# Patient Record
Sex: Female | Born: 1985 | Race: Black or African American | Hispanic: No | Marital: Single | Smoking: Never smoker
Health system: Southern US, Community
[De-identification: ages and names within clinical notes are randomized; demographics above are authoritative.]

---

## 2019-09-15 ENCOUNTER — Encounter (HOSPITAL_COMMUNITY): Payer: Self-pay | Admitting: Family Medicine

## 2019-09-15 ENCOUNTER — Other Ambulatory Visit: Payer: Self-pay

## 2019-09-15 ENCOUNTER — Inpatient Hospital Stay (HOSPITAL_COMMUNITY)
Admission: AD | Admit: 2019-09-15 | Discharge: 2019-09-15 | Disposition: A | Discharge status: Home / Self Care | Payer: Medicaid Other | Attending: Family Medicine | Admitting: Family Medicine

## 2019-09-15 ENCOUNTER — Inpatient Hospital Stay (HOSPITAL_COMMUNITY): Payer: Medicaid Other

## 2019-09-15 DIAGNOSIS — R1031 Right lower quadrant pain: Secondary | ICD-10-CM | POA: Insufficient documentation

## 2019-09-15 DIAGNOSIS — R109 Unspecified abdominal pain: Secondary | ICD-10-CM | POA: Diagnosis present

## 2019-09-15 DIAGNOSIS — Z3A01 Less than 8 weeks gestation of pregnancy: Secondary | ICD-10-CM | POA: Insufficient documentation

## 2019-09-15 DIAGNOSIS — O210 Mild hyperemesis gravidarum: Secondary | ICD-10-CM | POA: Diagnosis not present

## 2019-09-15 DIAGNOSIS — R1032 Left lower quadrant pain: Secondary | ICD-10-CM | POA: Diagnosis not present

## 2019-09-15 DIAGNOSIS — O26899 Other specified pregnancy related conditions, unspecified trimester: Secondary | ICD-10-CM

## 2019-09-15 DIAGNOSIS — O26891 Other specified pregnancy related conditions, first trimester: Secondary | ICD-10-CM | POA: Diagnosis not present

## 2019-09-15 DIAGNOSIS — R102 Pelvic and perineal pain: Secondary | ICD-10-CM

## 2019-09-15 DIAGNOSIS — O219 Vomiting of pregnancy, unspecified: Secondary | ICD-10-CM

## 2019-09-15 LAB — URINALYSIS, ROUTINE W REFLEX MICROSCOPIC
Bacteria, UA: NONE SEEN
Bilirubin Urine: NEGATIVE
Glucose, UA: NEGATIVE mg/dL
Ketones, ur: 5 mg/dL — AB
Leukocytes,Ua: NEGATIVE
Nitrite: NEGATIVE
Protein, ur: NEGATIVE mg/dL
Specific Gravity, Urine: 1.031 — ABNORMAL HIGH (ref 1.005–1.030)
pH: 5 (ref 5.0–8.0)

## 2019-09-15 LAB — CBC
HCT: 34.3 % — ABNORMAL LOW (ref 36.0–46.0)
Hemoglobin: 10.9 g/dL — ABNORMAL LOW (ref 12.0–15.0)
MCH: 25.2 pg — ABNORMAL LOW (ref 26.0–34.0)
MCHC: 31.8 g/dL (ref 30.0–36.0)
MCV: 79.4 fL — ABNORMAL LOW (ref 80.0–100.0)
Platelets: 266 10*3/uL (ref 150–400)
RBC: 4.32 MIL/uL (ref 3.87–5.11)
RDW: 14.1 % (ref 11.5–15.5)
WBC: 8.6 10*3/uL (ref 4.0–10.5)
nRBC: 0 % (ref 0.0–0.2)

## 2019-09-15 LAB — WET PREP, GENITAL
Sperm: NONE SEEN
Trich, Wet Prep: NONE SEEN
Yeast Wet Prep HPF POC: NONE SEEN

## 2019-09-15 LAB — GC/CHLAMYDIA PROBE AMP (~~LOC~~) NOT AT ARMC
Chlamydia: NEGATIVE
Comment: NEGATIVE
Comment: NORMAL
Neisseria Gonorrhea: NEGATIVE

## 2019-09-15 LAB — POC URINE PREG, ED
Preg Test, Ur: POSITIVE — AB
Preg Test, Ur: POSITIVE — AB

## 2019-09-15 LAB — ABO/RH: ABO/RH(D): B POS

## 2019-09-15 LAB — HCG, QUANTITATIVE, PREGNANCY: hCG, Beta Chain, Quant, S: 78559 m[IU]/mL — ABNORMAL HIGH (ref <5)

## 2019-09-15 LAB — HIV ANTIBODY (ROUTINE TESTING W REFLEX): HIV Screen 4th Generation wRfx: NONREACTIVE

## 2019-09-15 MED ORDER — PROMETHAZINE HCL 25 MG PO TABS
25.0000 mg | ORAL_TABLET | Freq: Once | ORAL | Status: AC
  Administered 2019-09-15: 06:00:00 25 mg via ORAL
  Filled 2019-09-15: qty 1

## 2019-09-15 MED ORDER — PROMETHAZINE HCL 25 MG PO TABS: 25.0000 mg | ORAL_TABLET | Freq: Four times a day (QID) | ORAL | 2 refills | Status: AC | PRN

## 2019-09-15 NOTE — ED Notes (Signed)
PA Dahlia Client made aware of patient, will come assess patient for transfer to [facility]U once POC urine preg is obtained/positive.

## 2019-09-15 NOTE — Discharge Instructions (Signed)
First Trimester of Pregnancy The first trimester of pregnancy is from week 1 until the end of week 13 (months 1 through 3). A week after a sperm fertilizes an egg, the egg will implant on the wall of the uterus. This embryo will begin to develop into a baby. Genes from you and your partner will form the baby. The female genes will determine whether the baby will be a boy or a girl. At 6-8 weeks, the eyes and face will be formed, and the heartbeat can be seen on ultrasound. At the end of 12 weeks, all the baby's organs will be formed. Now that you are pregnant, you will want to do everything you can to have a healthy baby. Two of the most important things are to get good prenatal care and to follow your health care provider's instructions. Prenatal care is all the medical care you receive before the baby's birth. This care will help prevent, find, and treat any problems during the pregnancy and childbirth. Body changes during your first trimester Your body goes through many changes during pregnancy. The changes vary from woman to woman.  You may gain or lose a couple of pounds at first.  You may feel sick to your stomach (nauseous) and you may throw up (vomit). If the vomiting is uncontrollable, call your health care provider.  You may tire easily.  You may develop headaches that can be relieved by medicines. All medicines should be approved by your health care provider.  You may urinate more often. Painful urination may mean you have a bladder infection.  You may develop heartburn as a result of your pregnancy.  You may develop constipation because certain hormones are causing the muscles that push stool through your intestines to slow down.  You may develop hemorrhoids or swollen veins (varicose veins).  Your breasts may begin to grow larger and become tender. Your nipples may stick out more, and the tissue that surrounds them (areola) may become darker.  Your gums may bleed and may be  sensitive to brushing and flossing.  Dark spots or blotches (chloasma, mask of pregnancy) may develop on your face. This will likely fade after the baby is born.  Your menstrual periods will stop.  You may have a loss of appetite.  You may develop cravings for certain kinds of food.  You may have changes in your emotions from day to day, such as being excited to be pregnant or being concerned that something may go wrong with the pregnancy and baby.  You may have more vivid and strange dreams.  You may have changes in your hair. These can include thickening of your hair, rapid growth, and changes in texture. Some women also have hair loss during or after pregnancy, or hair that feels dry or thin. Your hair will most likely return to normal after your baby is born. What to expect at prenatal visits During a routine prenatal visit:  You will be weighed to make sure you and the baby are growing normally.  Your blood pressure will be taken.  Your abdomen will be measured to track your baby's growth.  The fetal heartbeat will be listened to between weeks 10 and 14 of your pregnancy.  Test results from any previous visits will be discussed. Your health care provider may ask you:  How you are feeling.  If you are feeling the baby move.  If you have had any abnormal symptoms, such as leaking fluid, bleeding, severe headaches, or abdominal   cramping.  If you are using any tobacco products, including cigarettes, chewing tobacco, and electronic cigarettes.  If you have any questions. Other tests that may be performed during your first trimester include:  Blood tests to find your blood type and to check for the presence of any previous infections. The tests will also be used to check for low iron levels (anemia) and protein on red blood cells (Rh antibodies). Depending on your risk factors, or if you previously had diabetes during pregnancy, you may have tests to check for high blood sugar  that affects pregnant women (gestational diabetes).  Urine tests to check for infections, diabetes, or protein in the urine.  An ultrasound to confirm the proper growth and development of the baby.  Fetal screens for spinal cord problems (spina bifida) and Down syndrome.  HIV (human immunodeficiency virus) testing. Routine prenatal testing includes screening for HIV, unless you choose not to have this test.  You may need other tests to make sure you and the baby are doing well. Follow these instructions at home: Medicines  Follow your health care provider's instructions regarding medicine use. Specific medicines may be either safe or unsafe to take during pregnancy.  Take a prenatal vitamin that contains at least 600 micrograms (mcg) of folic acid.  If you develop constipation, try taking a stool softener if your health care provider approves. Eating and drinking   Eat a balanced diet that includes fresh fruits and vegetables, whole grains, good sources of protein such as meat, eggs, or tofu, and low-fat dairy. Your health care provider will help you determine the amount of weight gain that is right for you.  Avoid raw meat and uncooked cheese. These carry germs that can cause birth defects in the baby.  Eating four or five small meals rather than three large meals a day may help relieve nausea and vomiting. If you start to feel nauseous, eating a few soda crackers can be helpful. Drinking liquids between meals, instead of during meals, also seems to help ease nausea and vomiting.  Limit foods that are high in fat and processed sugars, such as fried and sweet foods.  To prevent constipation: ? Eat foods that are high in fiber, such as fresh fruits and vegetables, whole grains, and beans. ? Drink enough fluid to keep your urine clear or pale yellow. Activity  Exercise only as directed by your health care provider. Most women can continue their usual exercise routine during  pregnancy. Try to exercise for 30 minutes at least 5 days a week. Exercising will help you: ? Control your weight. ? Stay in shape. ? Be prepared for labor and delivery.  Experiencing pain or cramping in the lower abdomen or lower back is a good sign that you should stop exercising. Check with your health care provider before continuing with normal exercises.  Try to avoid standing for long periods of time. Move your legs often if you must stand in one place for a long time.  Avoid heavy lifting.  Wear low-heeled shoes and practice good posture.  You may continue to have sex unless your health care provider tells you not to. Relieving pain and discomfort  Wear a good support bra to relieve breast tenderness.  Take warm sitz baths to soothe any pain or discomfort caused by hemorrhoids. Use hemorrhoid cream if your health care provider approves.  Rest with your legs elevated if you have leg cramps or low back pain.  If you develop varicose veins in   your legs, wear support hose. Elevate your feet for 15 minutes, 3-4 times a day. Limit salt in your diet. Prenatal care  Schedule your prenatal visits by the twelfth week of pregnancy. They are usually scheduled monthly at first, then more often in the last 2 months before delivery.  Write down your questions. Take them to your prenatal visits.  Keep all your prenatal visits as told by your health care provider. This is important. Safety  Wear your seat belt at all times when driving.  Make a list of emergency phone numbers, including numbers for family, friends, the hospital, and police and fire departments. General instructions  Ask your health care provider for a referral to a local prenatal education class. Begin classes no later than the beginning of month 6 of your pregnancy.  Ask for help if you have counseling or nutritional needs during pregnancy. Your health care provider can offer advice or refer you to specialists for help  with various needs.  Do not use hot tubs, steam rooms, or saunas.  Do not douche or use tampons or scented sanitary pads.  Do not cross your legs for long periods of time.  Avoid cat litter boxes and soil used by cats. These carry germs that can cause birth defects in the baby and possibly loss of the fetus by miscarriage or stillbirth.  Avoid all smoking, herbs, alcohol, and medicines not prescribed by your health care provider. Chemicals in these products affect the formation and growth of the baby.  Do not use any products that contain nicotine or tobacco, such as cigarettes and e-cigarettes. If you need help quitting, ask your health care provider. You may receive counseling support and other resources to help you quit.  Schedule a dentist appointment. At home, brush your teeth with a soft toothbrush and be gentle when you floss. Contact a health care provider if:  You have dizziness.  You have mild pelvic cramps, pelvic pressure, or nagging pain in the abdominal area.  You have persistent nausea, vomiting, or diarrhea.  You have a bad smelling vaginal discharge.  You have pain when you urinate.  You notice increased swelling in your face, hands, legs, or ankles.  You are exposed to fifth disease or chickenpox.  You are exposed to [place] measles (rubella) and have never had it. Get help right away if:  You have a fever.  You are leaking fluid from your vagina.  You have spotting or bleeding from your vagina.  You have severe abdominal cramping or pain.  You have rapid weight gain or loss.  You vomit blood or material that looks like coffee grounds.  You develop a severe headache.  You have shortness of breath.  You have any kind of trauma, such as from a fall or a car accident. Summary  The first trimester of pregnancy is from week 1 until the end of week 13 (months 1 through 3).  Your body goes through many changes during pregnancy. The changes vary from  woman to woman.  You will have routine prenatal visits. During those visits, your health care provider will examine you, discuss any test results you may have, and talk with you about how you are feeling. This information is not intended to replace advice given to you by your health care provider. Make sure you discuss any questions you have with your health care provider. Document Revised: 04/25/2017 Document Reviewed: 04/24/2016 Elsevier Patient Education  2020 Elsevier Inc.   [place] [facility] Providers  Center for [facility] at [facility]       Phone: [phone]  Center for [facility] at [place]   Phone: [phone]  Center for [facility] at [place]  Phone: [phone]  Center for [facility] at [facility]  Phone: [phone]  Center for [facility] at [place]  Phone: [phone]  [facility] for [facility] at [facility]   Phone: [phone]  [facility]       Phone: [phone]  [facility]    Phone: [phone]   [facility]    Phone: [phone]  [facility]    Phone: [phone]  [facility]    Phone: [phone]  [facility] Health Department-Family Planning       Phone: [phone]   [facility] Health Department-Maternity  Phone: [phone]  [facility]    Phone: [phone]  [facility]   Phone: [phone]  [facility]      Phone: [phone]  [facility]    Phone: [phone]

## 2019-09-15 NOTE — [facility] Provider Note (Signed)
Chief Complaint: Abdominal Pain   First Provider Initiated Contact with Patient 09/15/19 0519        SUBJECTIVE HPI: Karen Blanchard is a 34 y.o. G3P0002 at [redacted]w[redacted]d by LMP who presents to maternity admissions reporting pelvic pain for one week.  Also has some nausea and vomiting. Has not tried anything for this.  . She denies vaginal bleeding, vaginal itching/burning, urinary symptoms, h/a, dizziness, or fever/chills.    Abdominal Pain This is a new problem. The current episode started in the past 7 days. The onset quality is gradual. The problem occurs intermittently. The problem has been unchanged. The pain is located in the suprapubic region, LLQ and RLQ. The quality of the pain is cramping. The abdominal pain does not radiate. Associated symptoms include nausea and vomiting. Pertinent negatives include no constipation, diarrhea, dysuria, fever or frequency. Nothing aggravates the pain. The pain is relieved by nothing. She has tried nothing for the symptoms.  Emesis  This is a new problem. The current episode started 1 to 4 weeks ago. There has been no fever. Associated symptoms include abdominal pain. Pertinent negatives include no chest pain, chills, coughing, diarrhea, dizziness or fever. She has tried nothing for the symptoms.   ED Note: Patient is a G32P68 34 year old female, approximately 5 weeks who presents to the emergency department with a chief complaint of lower abdominal pain and cramping x1 week. She reports associated nausea and has vomiting when she tries to eat. She denies any fever chills. She denies any dysuria or hematuria. She denies any vaginal bleeding, but states that she has had some slight discharge. She denies any other associated symptoms.  History reviewed. No pertinent past medical history. History reviewed. No pertinent surgical history. Social History   Socioeconomic History  . Marital status: Single    Spouse name: Not on file  . Number of children: Not on  file  . Years of education: Not on file  . Highest education level: Not on file  Occupational History  . Not on file  Tobacco Use  . Smoking status: Never Smoker  . Smokeless tobacco: Never Used  Substance and Sexual Activity  . Alcohol use: Never  . Drug use: Never  . Sexual activity: Yes  Other Topics Concern  . Not on file  Social History Narrative  . Not on file   Social Determinants of Health   Financial Resource Strain:   . Difficulty of Paying Living Expenses:   Food Insecurity:   . Worried About Programme researcher, broadcasting/film/video in the Last Year:   . Barista in the Last Year:   Transportation Needs:   . Freight forwarder (Medical):   Marland Kitchen Lack of Transportation (Non-Medical):   Physical Activity:   . Days of Exercise per Week:   . Minutes of Exercise per Session:   Stress:   . Feeling of Stress :   Social Connections:   . Frequency of Communication with Friends and Family:   . Frequency of Social Gatherings with Friends and Family:   . Attends Religious Services:   . Active Member of Clubs or Organizations:   . Attends Banker Meetings:   Marland Kitchen Marital Status:   Intimate Partner Violence:   . Fear of Current or Ex-Partner:   . Emotionally Abused:   Marland Kitchen Physically Abused:   . Sexually Abused:    No current facility-administered medications on file prior to encounter.   No current outpatient medications on file prior to  encounter.   No Known Allergies  I have reviewed patient's Past Medical Hx, Surgical Hx, Family Hx, Social Hx, medications and allergies.   ROS:  Review of Systems  Constitutional: Negative for chills and fever.  Respiratory: Negative for cough.   Cardiovascular: Negative for chest pain.  Gastrointestinal: Positive for abdominal pain, nausea and vomiting. Negative for constipation and diarrhea.  Genitourinary: Negative for dysuria and frequency.  Neurological: Negative for dizziness.   Review of Systems  Other systems  negative   Physical Exam  Physical Exam Patient Vitals for the past 24 hrs:  BP Temp Temp src Pulse Resp SpO2 Height Weight  09/15/19 0504 106/61 98.3 F (36.8 C) Oral (!) 58 20 -- 5\' 8"  (1.727 m) 86.3 kg  09/15/19 0407 (!) 122/52 (!) 97.4 F (36.3 C) Oral 60 16 100 % -- --   Constitutional: Well-developed, well-nourished female in no acute distress.  Cardiovascular: normal rate Respiratory: normal effort GI: Abd soft, non-tender. Pos BS x 4 MS: Extremities nontender, no edema, normal ROM Neurologic: Alert and oriented x 4.  GU: Neg CVAT.  PELVIC EXAM: Cervix pink, visually closed, without lesion, scant white creamy discharge, vaginal walls and external genitalia normal Bimanual exam: Cervix 0/long/high, firm, anterior, neg CMT, uterus tender, nonenlarged, adnexa without tenderness, enlargement, or mass    LAB RESULTS Results for orders placed or performed during the hospital encounter of 09/15/19 (from the past 24 hour(s))  POC Urine Pregnancy, ED (not at [facility])     Status: Abnormal   Collection Time: 09/15/19  4:14 AM  Result Value Ref Range   Preg Test, Ur POSITIVE (A) NEGATIVE  POC Urine Pregnancy, ED (not at [facility])     Status: Abnormal   Collection Time: 09/15/19  4:24 AM  Result Value Ref Range   Preg Test, Ur POSITIVE (A) NEGATIVE  Wet prep, genital     Status: Abnormal   Collection Time: 09/15/19  5:20 AM   Specimen: Vaginal  Result Value Ref Range   Yeast Wet Prep HPF POC NONE SEEN NONE SEEN   Trich, Wet Prep NONE SEEN NONE SEEN   Clue Cells Wet Prep HPF POC PRESENT (A) NONE SEEN   WBC, Wet Prep HPF POC MODERATE (A) NONE SEEN   Sperm NONE SEEN   CBC     Status: Abnormal   Collection Time: 09/15/19  5:28 AM  Result Value Ref Range   WBC 8.6 4.0 - 10.5 K/uL   RBC 4.32 3.87 - 5.11 MIL/uL   Hemoglobin 10.9 (L) 12.0 - 15.0 g/dL   HCT 34.3 (L) 36.0 - 46.0 %   MCV 79.4 (L) 80.0 - 100.0 fL   MCH 25.2 (L) 26.0 - 34.0 pg   MCHC 31.8 30.0 - 36.0 g/dL   RDW 14.1 11.5 -  15.5 %   Platelets 266 150 - 400 K/uL   nRBC 0.0 0.0 - 0.2 %  ABO/Rh     Status: None   Collection Time: 09/15/19  5:42 AM  Result Value Ref Range   ABO/RH(D) B POS    No rh immune globuloin      NOT A RH IMMUNE GLOBULIN CANDIDATE, PT RH POSITIVE Performed at [facility], [address], [place], [place] [address]   Urinalysis, Routine w reflex microscopic     Status: Abnormal   Collection Time: 09/15/19  6:02 AM  Result Value Ref Range   Color, Urine YELLOW YELLOW   APPearance CLEAR CLEAR   Specific Gravity, Urine 1.031 (  H) 1.005 - 1.030   pH 5.0 5.0 - 8.0   Glucose, UA NEGATIVE NEGATIVE mg/dL   Hgb urine dipstick SMALL (A) NEGATIVE   Bilirubin Urine NEGATIVE NEGATIVE   Ketones, ur 5 (A) NEGATIVE mg/dL   Protein, ur NEGATIVE NEGATIVE mg/dL   Nitrite NEGATIVE NEGATIVE   Leukocytes,Ua NEGATIVE NEGATIVE   RBC / HPF 6-10 0 - 5 RBC/hpf   WBC, UA 0-5 0 - 5 WBC/hpf   Bacteria, UA NONE SEEN NONE SEEN   Squamous Epithelial / LPF 0-5 0 - 5   Mucus PRESENT     IMAGING US OB Comp Less 14 Wks  Result Date: 09/15/2019 CLINICAL DATA:  Lower abdominal pain for 2 weeks. Positive urine pregnancy test. EXAM: OBSTETRIC <14 WK [place] AND TRANSVAGINAL OB US TECHNIQUE: Both transabdominal and transvaginal ultrasound examinations were performed for complete evaluation of the gestation as well as the maternal uterus, adnexal regions, and pelvic cul-de-sac. Transvaginal technique was performed to assess early pregnancy. COMPARISON:  None. FINDINGS: Intrauterine gestational sac: Present Yolk sac:  Present Embryo:  Present Cardiac Activity: Present Heart Rate: 140 bpm CRL:  11.6 mm   7 w   2 d                  [place] EDC: 05/01/2020 Subchorionic hemorrhage:  None visualized. Maternal uterus/adnexae: Both ovaries are normal. No free pelvic fluid collections. IMPRESSION: 1. Single living intrauterine embryo estimated at 7 weeks and 2 days gestation. 2. No subchorionic hemorrhage. 3. Normal ovaries.  Electronically Signed   By: Rudie Meyer M.D.   On: 09/15/2019 06:50   US OB Transvaginal  Result Date: 09/15/2019 CLINICAL DATA:  Lower abdominal pain for 2 weeks. Positive urine pregnancy test. EXAM: OBSTETRIC <14 WK [place] AND TRANSVAGINAL OB US TECHNIQUE: Both transabdominal and transvaginal ultrasound examinations were performed for complete evaluation of the gestation as well as the maternal uterus, adnexal regions, and pelvic cul-de-sac. Transvaginal technique was performed to assess early pregnancy. COMPARISON:  None. FINDINGS: Intrauterine gestational sac: Present Yolk sac:  Present Embryo:  Present Cardiac Activity: Present Heart Rate: 140 bpm CRL:  11.6 mm   7 w   2 d                  [place] EDC: 05/01/2020 Subchorionic hemorrhage:  None visualized. Maternal uterus/adnexae: Both ovaries are normal. No free pelvic fluid collections. IMPRESSION: 1. Single living intrauterine embryo estimated at 7 weeks and 2 days gestation. 2. No subchorionic hemorrhage. 3. Normal ovaries. Electronically Signed   By: Rudie Meyer M.D.   On: 09/15/2019 06:50     MAU Management/MDM: Ordered usual first trimester r/o ectopic labs.   Pelvic exam and cultures done Will check baseline Ultrasound to rule out ectopic.  This bleeding/pain can represent a normal pregnancy with bleeding, spontaneous abortion or even an ectopic which can be life-threatening.  The process as listed above helps to determine which of these is present.  Phenergan 25mg  po given which did help her nausea. She was able to tolerate PO intake afterward.   ASSESSMENT 1. Abdominal pain during pregnancy in first trimester   2.     Nausea and vomiting of pregnancy  PLAN Discharge home Rx Phenergan for prn use for nausea List of OB providers given  Pt stable at time of discharge. Encouraged to return here or to other Urgent Care/ED if she develops worsening of symptoms, increase in pain, fever, or other concerning symptoms.     CNM, MSN Certified Nurse-Midwife 09/15/2019  5:19 AM

## 2019-09-15 NOTE — ED Provider Notes (Signed)
[facility] EMERGENCY DEPARTMENT Provider Note   CSN: [id number] Arrival date & time: 09/15/19  0402     History No chief complaint on file.   Karen Blanchard is a 34 y.o. female.  Patient is a G64P81 34 year old female, approximately 5 weeks who presents to the emergency department with a chief complaint of lower abdominal pain and cramping x1 week. She reports associated nausea and has vomiting when she tries to eat. She denies any fever chills. She denies any dysuria or hematuria. She denies any vaginal bleeding, but states that she has had some slight discharge. She denies any other associated symptoms.  The history is provided by the patient. No language interpreter was used.       No past medical history on file.  There are no problems to display for this patient.    OB History   No obstetric history on file.     No family history on file.  Social History   Tobacco Use  . Smoking status: Not on file  Substance Use Topics  . Alcohol use: Not on file  . Drug use: Not on file    Home Medications Prior to Admission medications   Not on File    Allergies    Patient has no allergy information on record.  Review of Systems   Review of Systems  Gastrointestinal: Positive for abdominal pain, nausea and vomiting.  Genitourinary: Positive for vaginal discharge. Negative for vaginal bleeding.  All other systems reviewed and are negative.   Physical Exam Updated Vital Signs BP (!) 122/52 (BP Location: Left Arm)   Pulse 60   Temp (!) 97.4 F (36.3 C) (Oral)   Resp 16   SpO2 100%   Physical Exam Vitals and nursing note reviewed.  Constitutional:      General: She is not in acute distress.    Appearance: She is well-developed.  HENT:     Head: Normocephalic and atraumatic.  Eyes:     Conjunctiva/sclera: Conjunctivae normal.  Cardiovascular:     Rate and Rhythm: Normal rate.     Heart sounds: No murmur.  Pulmonary:     Effort:  Pulmonary effort is normal. No respiratory distress.  Abdominal:     General: There is no distension.     Tenderness: There is no abdominal tenderness.     Comments: No focal abdominal tenderness, there is generalized discomfort  Genitourinary:    Comments: Deferred to MAU Musculoskeletal:     Cervical back: Neck supple.     Comments: Moves all extremities  Skin:    General: Skin is warm and dry.  Neurological:     Mental Status: She is alert and oriented to person, place, and time.  Psychiatric:        Mood and Affect: Mood normal.        Behavior: Behavior normal.     ED Results / Procedures / Treatments   Labs (all labs ordered are listed, but only abnormal results are displayed) Labs Reviewed  POC URINE PREG, ED - Abnormal; Notable for the following components:      Result Value   Preg Test, Ur POSITIVE (*)    All other components within normal limits  POC URINE PREG, ED    EKG None  Radiology No results found.  Procedures Procedures (including critical care time)  Medications Ordered in ED Medications - No data to display  ED Course  I have reviewed the triage vital signs and the  nursing notes.  Pertinent labs & imaging results that were available during my care of the patient were reviewed by me and considered in my medical decision making (see chart for details).    MDM Rules/Calculators/A&P                      G23P84 34 year old female with lower abdominal pain and cramping which has been worsening over the past week. Denies any vaginal bleeding. She does not have any focal tenderness on my exam, but does have generalized discomfort. I discussed case with the CNM at MAU, who accepts in transfer to MAU.  Will need r/o ectopic.  Final Clinical Impression(s) / ED Diagnoses Final diagnoses:  Abdominal pain during pregnancy in first trimester    Rx / DC Orders ED Discharge Orders    None       Montine Circle, PA-C 09/15/19 1950    Ripley Fraise, MD 09/15/19 [phone]

## 2019-09-15 NOTE — MAU Note (Signed)
PT SAYS SHE HAS LOWER ABD PAIN - STARTED 2 WEEKS AGO .  HAS VAG D/C- WHITE  . LAST SEX-  1 WEEK AGO

## 2019-09-15 NOTE — ED Notes (Signed)
Pt cleared by Roxy Horseman, PA to go to MAU.

## 2019-10-09 ENCOUNTER — Inpatient Hospital Stay (HOSPITAL_COMMUNITY)
Admission: AD | Admit: 2019-10-09 | Discharge: 2019-10-09 | Disposition: A | Discharge status: Home / Self Care | Payer: Medicaid Other | Attending: Obstetrics & Gynecology | Admitting: Obstetrics & Gynecology

## 2019-10-09 ENCOUNTER — Encounter (HOSPITAL_COMMUNITY): Payer: Self-pay | Admitting: Obstetrics & Gynecology

## 2019-10-09 ENCOUNTER — Other Ambulatory Visit: Payer: Self-pay

## 2019-10-09 DIAGNOSIS — Z3A10 10 weeks gestation of pregnancy: Secondary | ICD-10-CM | POA: Diagnosis not present

## 2019-10-09 DIAGNOSIS — O26891 Other specified pregnancy related conditions, first trimester: Secondary | ICD-10-CM | POA: Diagnosis not present

## 2019-10-09 DIAGNOSIS — O219 Vomiting of pregnancy, unspecified: Secondary | ICD-10-CM | POA: Insufficient documentation

## 2019-10-09 DIAGNOSIS — R1011 Right upper quadrant pain: Secondary | ICD-10-CM | POA: Insufficient documentation

## 2019-10-09 LAB — COMPREHENSIVE METABOLIC PANEL
ALT: 10 U/L (ref 0–44)
AST: 12 U/L — ABNORMAL LOW (ref 15–41)
Albumin: 3 g/dL — ABNORMAL LOW (ref 3.5–5.0)
Alkaline Phosphatase: 50 U/L (ref 38–126)
Anion gap: 7 (ref 5–15)
BUN: 5 mg/dL — ABNORMAL LOW (ref 6–20)
CO2: 23 mmol/L (ref 22–32)
Calcium: 8.8 mg/dL — ABNORMAL LOW (ref 8.9–10.3)
Chloride: 105 mmol/L (ref 98–111)
Creatinine, Ser: 0.79 mg/dL (ref 0.44–1.00)
GFR calc Af Amer: >60 mL/min (ref >60)
GFR calc non Af Amer: >60 mL/min (ref >60)
Glucose, Bld: 95 mg/dL (ref 70–99)
Potassium: 3.9 mmol/L (ref 3.5–5.1)
Sodium: 135 mmol/L (ref 135–145)
Total Bilirubin: 0.5 mg/dL (ref 0.3–1.2)
Total Protein: 6.7 g/dL (ref 6.5–8.1)

## 2019-10-09 LAB — URINALYSIS, ROUTINE W REFLEX MICROSCOPIC
Bilirubin Urine: NEGATIVE
Glucose, UA: NEGATIVE mg/dL
Ketones, ur: NEGATIVE mg/dL
Nitrite: NEGATIVE
Protein, ur: 30 mg/dL — AB
Specific Gravity, Urine: 1.026 (ref 1.005–1.030)
pH: 6 (ref 5.0–8.0)

## 2019-10-09 LAB — CBC WITH DIFFERENTIAL/PLATELET
Abs Immature Granulocytes: 0.02 10*3/uL (ref 0.00–0.07)
Basophils Absolute: 0 10*3/uL (ref 0.0–0.1)
Basophils Relative: 0 %
Eosinophils Absolute: 0.2 10*3/uL (ref 0.0–0.5)
Eosinophils Relative: 2 %
HCT: 35.3 % — ABNORMAL LOW (ref 36.0–46.0)
Hemoglobin: 11.4 g/dL — ABNORMAL LOW (ref 12.0–15.0)
Immature Granulocytes: 0 %
Lymphocytes Relative: 25 %
Lymphs Abs: 2.5 10*3/uL (ref 0.7–4.0)
MCH: 26 pg (ref 26.0–34.0)
MCHC: 32.3 g/dL (ref 30.0–36.0)
MCV: 80.4 fL (ref 80.0–100.0)
Monocytes Absolute: 0.8 10*3/uL (ref 0.1–1.0)
Monocytes Relative: 8 %
Neutro Abs: 6.4 10*3/uL (ref 1.7–7.7)
Neutrophils Relative %: 65 %
Platelets: 254 10*3/uL (ref 150–400)
RBC: 4.39 MIL/uL (ref 3.87–5.11)
RDW: 14.4 % (ref 11.5–15.5)
WBC: 9.9 10*3/uL (ref 4.0–10.5)
nRBC: 0 % (ref 0.0–0.2)

## 2019-10-09 LAB — LIPASE, BLOOD: Lipase: 35 U/L (ref 11–51)

## 2019-10-09 LAB — AMYLASE: Amylase: 65 U/L (ref 28–100)

## 2019-10-09 MED ORDER — ACETAMINOPHEN 500 MG PO TABS
1000.0000 mg | ORAL_TABLET | Freq: Once | ORAL | Status: AC
  Administered 2019-10-09: 1000 mg via ORAL
  Filled 2019-10-09: qty 2

## 2019-10-09 MED ORDER — ONDANSETRON 4 MG PO TBDP: 4.0000 mg | ORAL_TABLET | Freq: Three times a day (TID) | ORAL | 0 refills | Status: AC | PRN

## 2019-10-09 NOTE — Discharge Instructions (Signed)
Follow these instructions at home: 1. Take over-the-counter and prescription medicines only as told by your health care provider. 2. Maintain a healthy weight and follow a healthy diet. This includes: ? Reducing fatty foods, such as fried food. ? Reducing refined carbohydrates, like white bread and white rice. ? Increasing fiber. Aim for foods like almonds, fruit, and beans. 3. Keep all follow-up visits as told by your health care provider. This is important.   Return for worsening pain not controlled by tylenol, uncontrolled vomiting, or fever (temperature above 100.5 in setting of abdominal pain).        [facility] [employer] for [employer] at [facility]  [address], [place], [place] [address]  [phone]  Center for [facility] at [facility]  [address], [place], [place] [address]  [phone]  Center for [facility] at [facility] [address], [place], [place] [address]  [phone]  Center for [facility] at [facility]  [address] [place], [place] [address]  [phone]  Center for [facility] at [facility]  [address]First floor), [place], [place] [address]  [phone]  Center for [facility] at [facility]  [address] [place], [place], [place] [address]  [phone]  [facility]  [address], [place], [place] [address]  [phone]  [facility]  [address] [place], [place], [place] [address]  [phone]  [facility]  [address] [place], [place] [address]  [phone]  [facility]  [address] [place], [place] [address]  [phone]  [facility]  [address], [place], [place] [address]  [phone]  [facility]   [address] [place], [place] [address]  [phone]  [facility]  [address], [place], [place] [address]    [phone]  [facility]  [address] [place], [place] [address]  [phone]

## 2019-10-09 NOTE — MAU Provider Note (Signed)
Chief Complaint: Abdominal Pain, Nausea, and Emesis   First Provider Initiated Contact with Patient 10/09/19 1608     SUBJECTIVE HPI: Karen Blanchard is a 34 y.o. G3P2002 at [redacted]w[redacted]d who presents to Maternity Admissions reporting abdominal pain. Symptoms started about 2 weeks ago. Reports intermittent pain in her right upper quadrant that occurs several times per day & lasts for hours at a time. Describes as aching with some sharp pain. Nothing makes pain better or worse. Has been having daily nausea & vomiting due to pregnancy. Has phenergan at home but doesn't take it much because of work & side effects. Reports eating a biscuit & [place] sandwich yesterday. Today she had a biscuit & tea prior to coming to MAU.  Denies lower abdominal pain, fever, diarrhea, constipation, or vaginal bleeding. Does not know where she will be going for prenatal care.   Location: abdomen, RUQ Quality: aching, sharp Severity: 10/10 on pain scale Duration: 2 weeks Timing: intermittent Modifying factors: none Associated signs and symptoms: n/v  History reviewed. No pertinent past medical history. OB History  Gravida Para Term Preterm AB Living  3 2 2     2   SAB TAB Ectopic Multiple Live Births          2    # Outcome Date GA Lbr Len/2nd Weight Sex Delivery Anes PTL Lv  3 Current           2 Term      Vag-Spont   LIV  1 Term      Vag-Spont   LIV   History reviewed. No pertinent surgical history. Social History   Socioeconomic History  . Marital status: Single    Spouse name: Not on file  . Number of children: Not on file  . Years of education: Not on file  . Highest education level: Not on file  Occupational History  . Not on file  Tobacco Use  . Smoking status: Never Smoker  . Smokeless tobacco: Never Used  Substance and Sexual Activity  . Alcohol use: Never  . Drug use: Never  . Sexual activity: Yes  Other Topics Concern  . Not on file  Social History Narrative  . Not on file   Social  Determinants of Health   Financial Resource Strain:   . Difficulty of Paying Living Expenses:   Food Insecurity:   . Worried About in the Last Year:   . Programme researcher, broadcasting/film/video in the Last Year:   Transportation Needs:   . Barista (Medical):   Freight forwarder Lack of Transportation (Non-Medical):   Physical Activity:   . Days of Exercise per Week:   . Minutes of Exercise per Session:   Stress:   . Feeling of Stress :   Social Connections:   . Frequency of Communication with Friends and Family:   . Frequency of Social Gatherings with Friends and Family:   . Attends Religious Services:   . Active Member of Clubs or Organizations:   . Attends Marland Kitchen Meetings:   Banker Marital Status:   Intimate Partner Violence:   . Fear of Current or Ex-Partner:   . Emotionally Abused:   Marland [place] Physically Abused:   . Sexually Abused:    History reviewed. No pertinent family history. No current facility-administered medications on file prior to encounter.   Current Outpatient Medications on File Prior to Encounter  Medication Sig Dispense Refill  . promethazine (PHENERGAN) 25 MG tablet Take 1 tablet (  25 mg total) by mouth every 6 (six) hours as needed for nausea or vomiting. 30 tablet 2   No Known Allergies  I have reviewed patient's Past Medical Hx, Surgical Hx, Family Hx, Social Hx, medications and allergies.   Review of Systems  Constitutional: Negative.   Gastrointestinal: Positive for abdominal pain, nausea and vomiting. Negative for constipation and diarrhea.  Genitourinary: Negative.     OBJECTIVE Patient Vitals for the past 24 hrs:  BP Temp Temp src Pulse Resp SpO2 Height Weight  10/09/19 1525 116/65 98.5 F (36.9 C) Oral 84 18 98 % 5\' 8"  (1.727 m) 84.8 kg   Constitutional: Well-developed, well-nourished female in no acute distress.  Cardiovascular: normal rate & rhythm, no murmur Respiratory: normal rate and effort. Lung sounds clear throughout GI:TTP in  RUQ, + Murphy sign. Abd soft, Pos BS x 4. No guarding or rebound tenderness MS: Extremities nontender, no edema, normal ROM Neurologic: Alert and oriented x 4.      LAB RESULTS Results for orders placed or performed during the hospital encounter of 10/09/19 (from the past 24 hour(s))  Urinalysis, Routine w reflex microscopic     Status: Abnormal   Collection Time: 10/09/19  3:49 PM  Result Value Ref Range   Color, Urine YELLOW YELLOW   APPearance HAZY (A) CLEAR   Specific Gravity, Urine 1.026 1.005 - 1.030   pH 6.0 5.0 - 8.0   Glucose, UA NEGATIVE NEGATIVE mg/dL   Hgb urine dipstick SMALL (A) NEGATIVE   Bilirubin Urine NEGATIVE NEGATIVE   Ketones, ur NEGATIVE NEGATIVE mg/dL   Protein, ur 30 (A) NEGATIVE mg/dL   Nitrite NEGATIVE NEGATIVE   Leukocytes,Ua SMALL (A) NEGATIVE   RBC / HPF 11-20 0 - 5 RBC/hpf   WBC, UA 0-5 0 - 5 WBC/hpf   Bacteria, UA RARE (A) NONE SEEN   Squamous Epithelial / LPF 6-10 0 - 5   Mucus PRESENT   Amylase     Status: None   Collection Time: 10/09/19  4:52 PM  Result Value Ref Range   Amylase 65 28 - 100 U/L  CBC with Differential/Platelet     Status: Abnormal   Collection Time: 10/09/19  4:52 PM  Result Value Ref Range   WBC 9.9 4.0 - 10.5 K/uL   RBC 4.39 3.87 - 5.11 MIL/uL   Hemoglobin 11.4 (L) 12.0 - 15.0 g/dL   HCT 10/11/19 (L) 58.8 - 50.2 %   MCV 80.4 80.0 - 100.0 fL   MCH 26.0 26.0 - 34.0 pg   MCHC 32.3 30.0 - 36.0 g/dL   RDW 77.4 12.8 - 78.6 %   Platelets 254 150 - 400 K/uL   nRBC 0.0 0.0 - 0.2 %   Neutrophils Relative % 65 %   Neutro Abs 6.4 1.7 - 7.7 K/uL   Lymphocytes Relative 25 %   Lymphs Abs 2.5 0.7 - 4.0 K/uL   Monocytes Relative 8 %   Monocytes Absolute 0.8 0.1 - 1.0 K/uL   Eosinophils Relative 2 %   Eosinophils Absolute 0.2 0.0 - 0.5 K/uL   Basophils Relative 0 %   Basophils Absolute 0.0 0.0 - 0.1 K/uL   Immature Granulocytes 0 %   Abs Immature Granulocytes 0.02 0.00 - 0.07 K/uL  Comprehensive metabolic panel     Status: Abnormal    Collection Time: 10/09/19  4:52 PM  Result Value Ref Range   Sodium 135 135 - 145 mmol/L   Potassium 3.9 3.5 - 5.1 mmol/L   Chloride  105 98 - 111 mmol/L   CO2 23 22 - 32 mmol/L   Glucose, Bld 95 70 - 99 mg/dL   BUN 5 (L) 6 - 20 mg/dL   Creatinine, Ser 0.79 0.44 - 1.00 mg/dL   Calcium 8.8 (L) 8.9 - 10.3 mg/dL   Total Protein 6.7 6.5 - 8.1 g/dL   Albumin 3.0 (L) 3.5 - 5.0 g/dL   AST 12 (L) 15 - 41 U/L   ALT 10 0 - 44 U/L   Alkaline Phosphatase 50 38 - 126 U/L   Total Bilirubin 0.5 0.3 - 1.2 mg/dL   GFR calc non Af Amer >60 >60 mL/min   GFR calc Af Amer >60 >60 mL/min   Anion gap 7 5 - 15  Lipase, blood     Status: None   Collection Time: 10/09/19  4:52 PM  Result Value Ref Range   Lipase 35 11 - 51 U/L    IMAGING No results found.  MAU COURSE Orders Placed This Encounter  Procedures  . US Abdomen Limited RUQ  . Urinalysis, Routine w reflex microscopic  . Amylase  . CBC with Differential/Platelet  . Comprehensive metabolic panel  . Lipase, blood  . Diet NPO time specified  . Discharge patient   Meds ordered this encounter  Medications  . acetaminophen (TYLENOL) tablet 1,000 mg  . ondansetron (ZOFRAN ODT) 4 MG disintegrating tablet    Sig: Take 1 tablet (4 mg total) by mouth every 8 (eight) hours as needed for nausea or vomiting.    Dispense:  15 tablet    Refill:  0    Order Specific Question:   Supervising Provider    Answer:   Verita Schneiders A [[id number]]    MDM FHT present via doppler  CBC, CMP, amylase, lipase Tylenol given  Suspect gallstones based on description & location of pain as well as abdominal exam. Labs & vital signs reassuring. Will get outpatient RUQ ultrasound to assess for gallstones since patient isn't fasting & is stable at this time.    ASSESSMENT 1. RUQ pain   2. Nausea and vomiting during pregnancy prior to [redacted] weeks gestation   3. [redacted] weeks gestation of pregnancy     PLAN Discharge home in stable condition. Rx zofran Outpatient  ultrasound ordered Start prenatal care - given list of providers Discussed reasons to return to MAU   Follow-up [facility] Follow up.   Specialty: Radiology Contact information: [place] [phone] [place] [phone]         Allergies as of 10/09/2019   No Known Allergies     Medication List    TAKE these medications   ondansetron 4 MG disintegrating tablet Commonly known as: Zofran ODT Take 1 tablet (4 mg total) by mouth every 8 (eight) hours as needed for nausea or vomiting.   promethazine 25 MG tablet Commonly known as: PHENERGAN Take 1 tablet (25 mg total) by mouth every 6 (six) hours as needed for nausea or vomiting.        Jorje Guild, NP 10/09/2019  5:37 PM

## 2019-10-09 NOTE — MAU Note (Signed)
Hurting, more at the top of abd.  Hasn't been able to eat, getting headaches. Unable to take the medicine as it makes her sleepy and then she can't work.

## 2021-04-13 IMAGING — US US OB COMP LESS 14 WK
1 series · 15 of 28 positions shown · non-contrast
Comparison: None.

CLINICAL DATA: Lower abdominal pain for 2 weeks. Positive urine
pregnancy test.

EXAM:
OBSTETRIC <14 WK US AND TRANSVAGINAL OB US
TECHNIQUE: Both transabdominal and transvaginal ultrasound examinations were
performed for complete evaluation of the gestation as well as the
maternal uterus, adnexal regions, and pelvic cul-de-sac.
Transvaginal technique was performed to assess early pregnancy.

[Series 1: us ob comp less 14 wk · 15 of 56 slices shown]
[im 1/56]
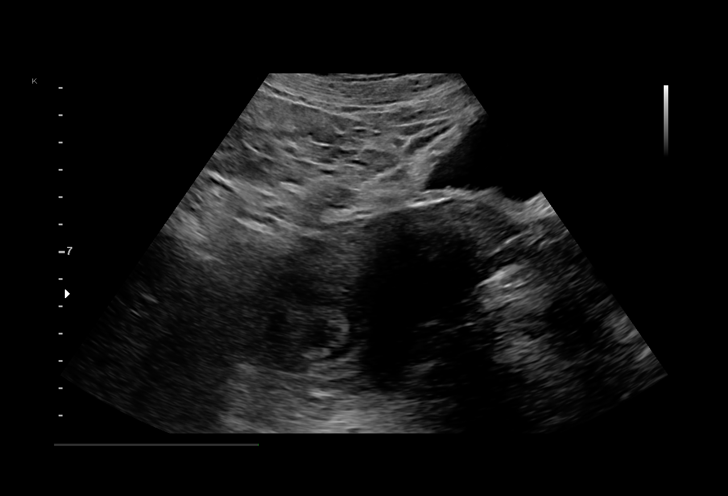
[im 5/56]
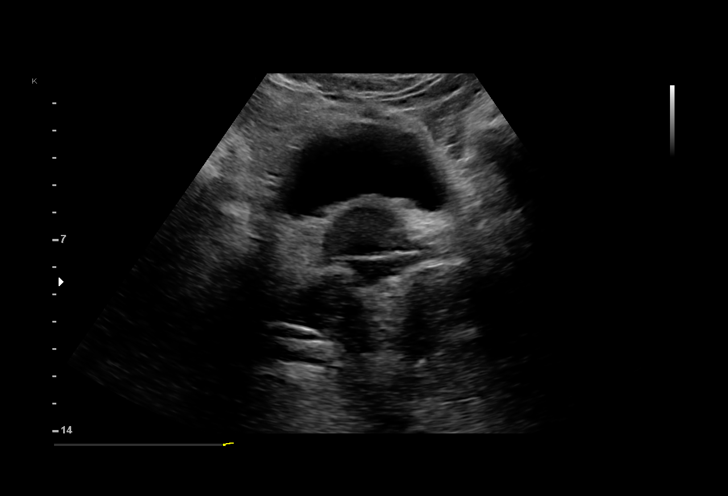
[im 9/56]
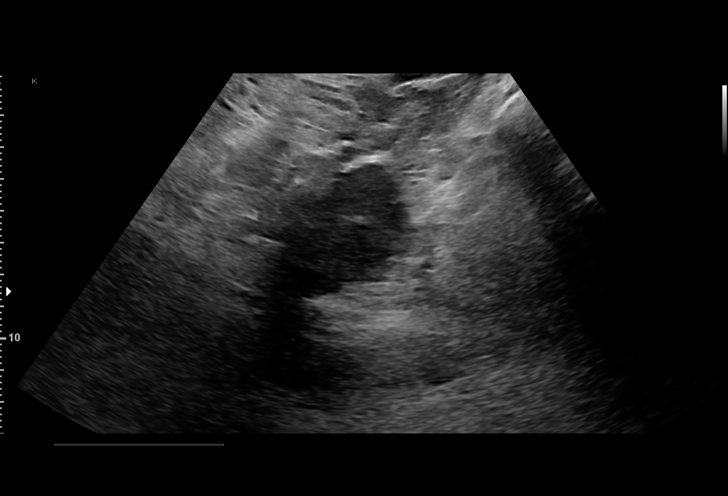
[im 13/56]
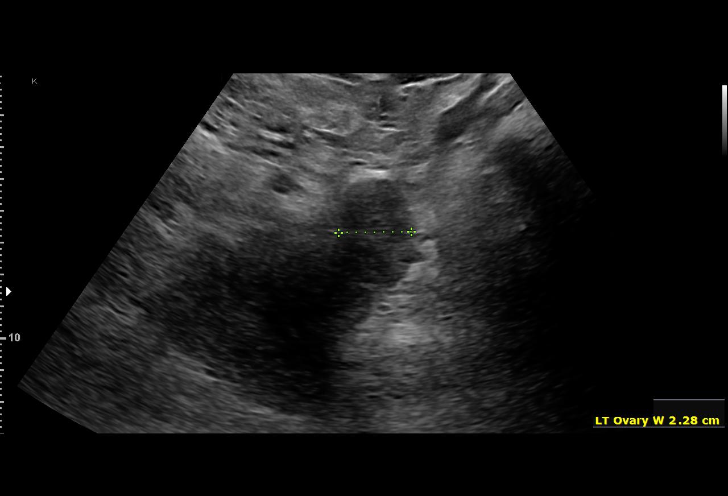
[im 17/56]
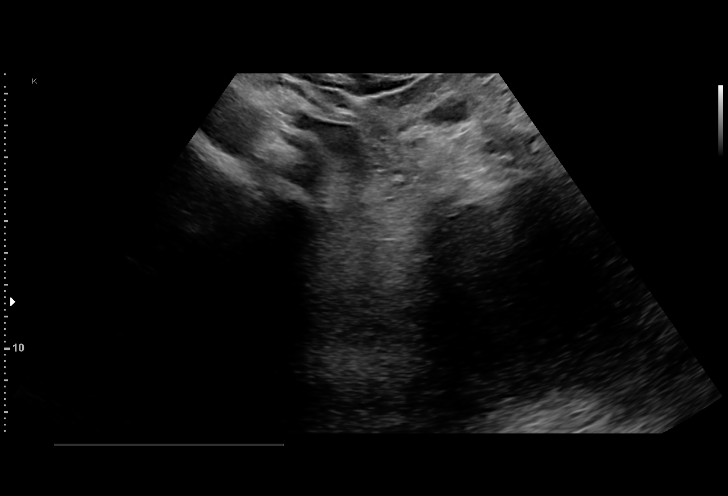
[im 21/56]
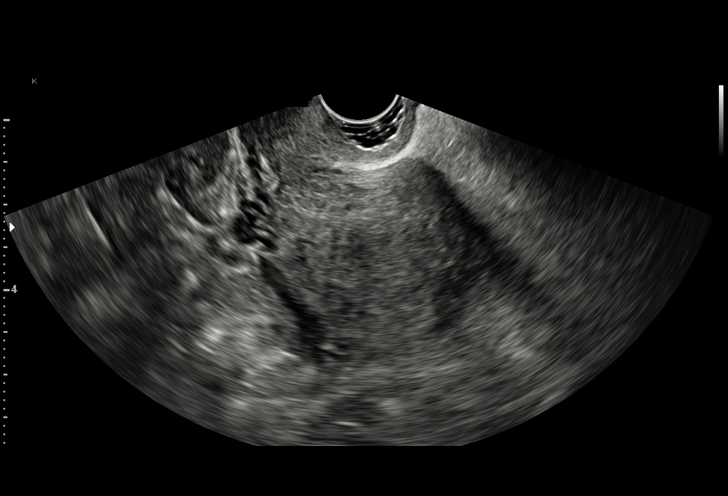
[im 25/56]
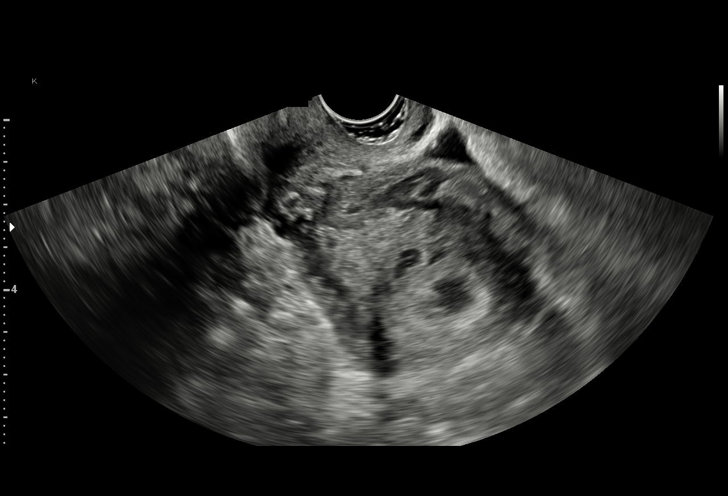
[im 29/56]
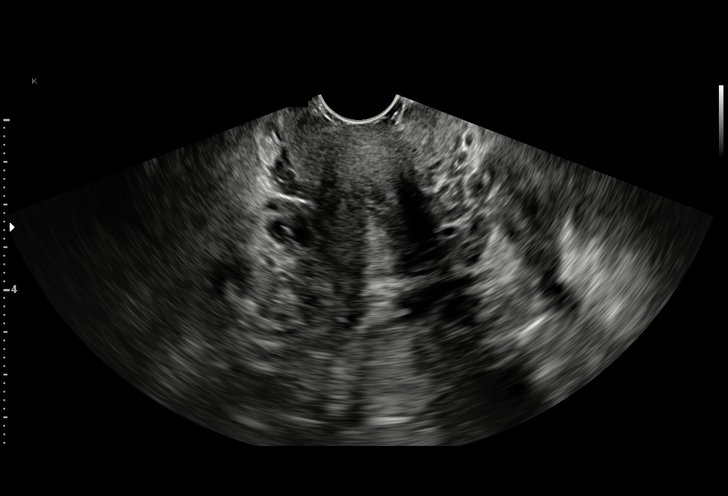
[im 31/56]
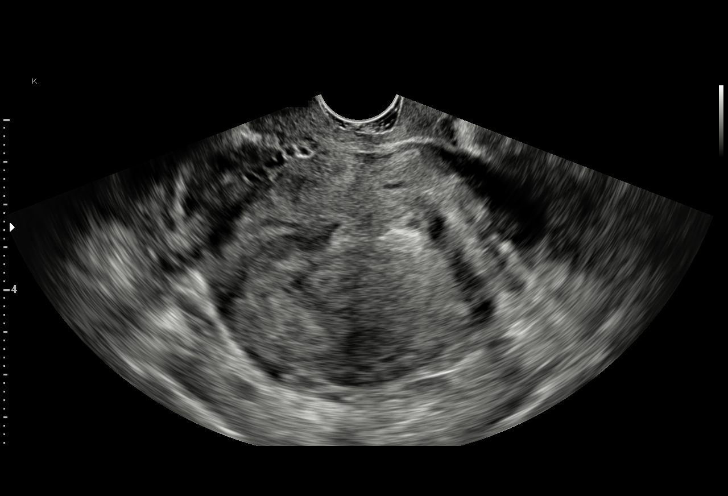
[im 35/56]
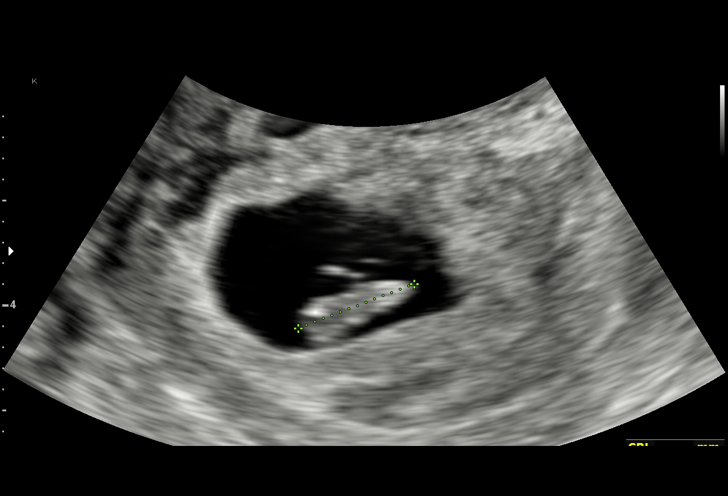
[im 39/56]
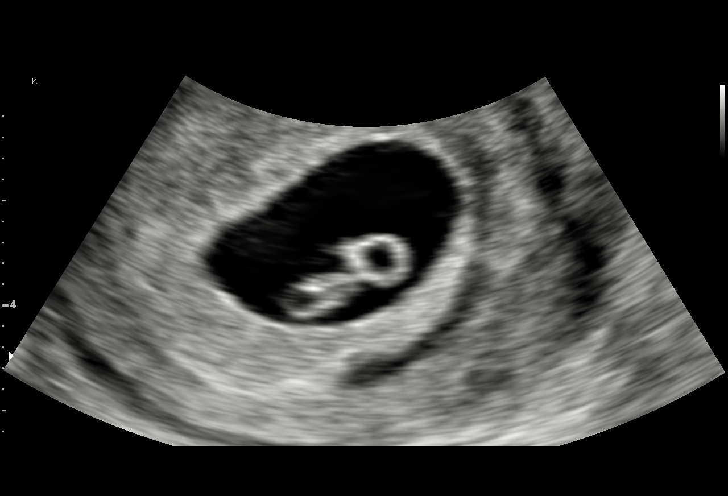
[im 43/56]
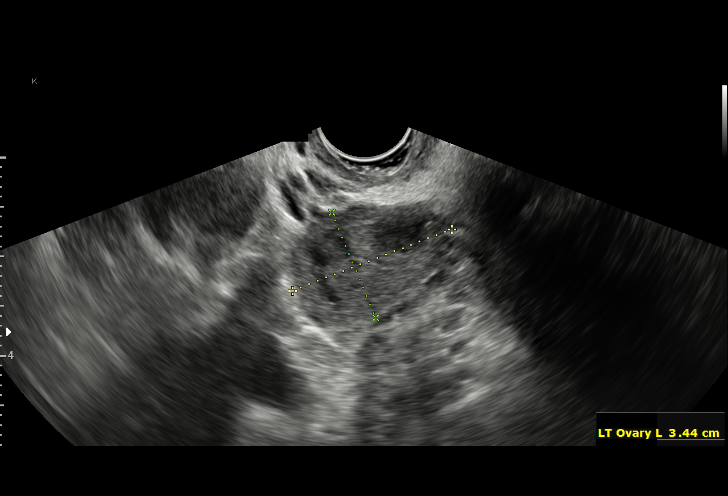
[im 47/56]
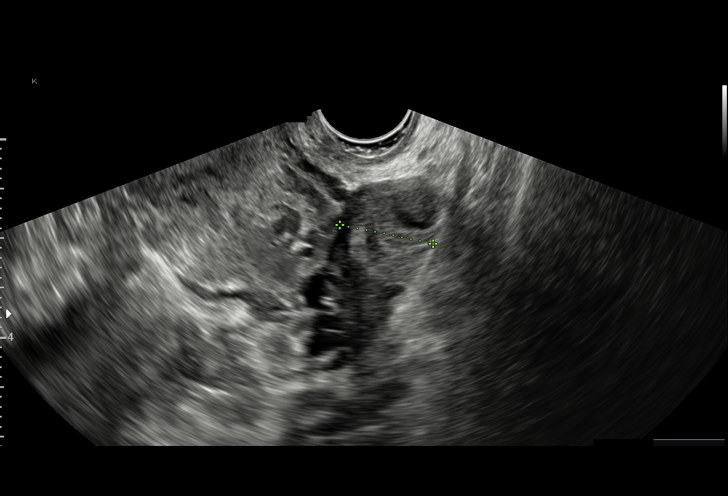
[im 51/56]
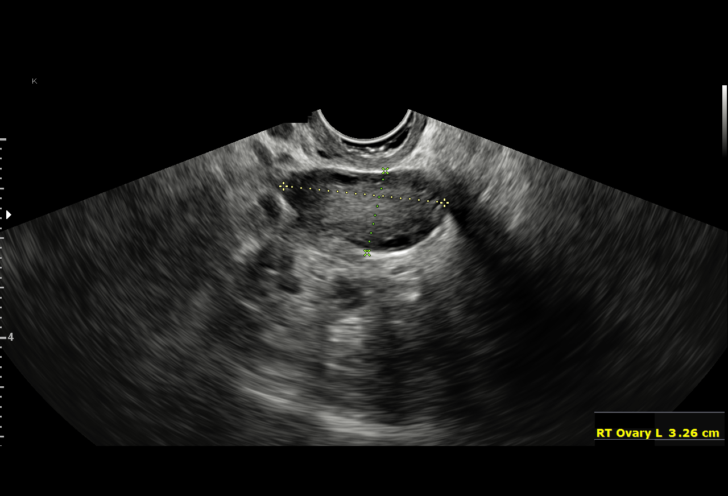
[im 56/56]
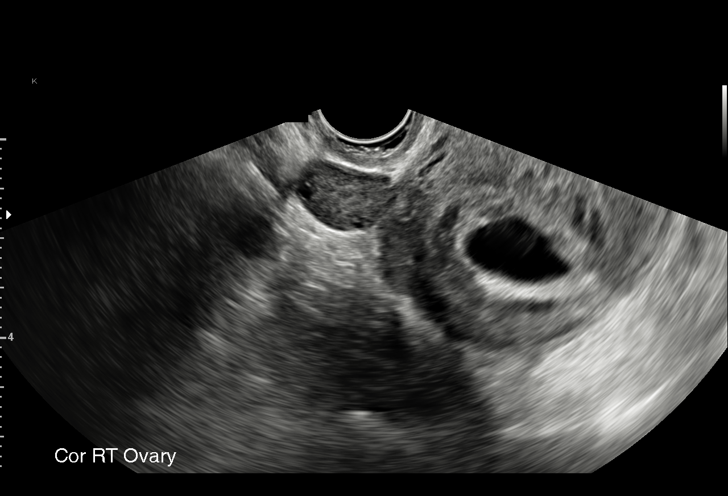

[15 of 28 positions shown; findings below may reference images not displayed]

FINDINGS: Intrauterine gestational sac: Present

Yolk sac:  Present

Embryo:  Present

Cardiac Activity: Present

Heart Rate: 140 bpm

CRL:  11.6 mm   7 w   2 d                  US EDC: 05/01/2020

Subchorionic hemorrhage:  None visualized.

Maternal uterus/adnexae: Both ovaries are normal. No free pelvic
fluid collections.
IMPRESSION: 1. Single living intrauterine embryo estimated at 7 weeks and 2 days
gestation.
2. No subchorionic hemorrhage.
3. Normal ovaries.
